# Patient Record
Sex: Female | Born: 1960 | Race: White | Hispanic: No
Health system: Southern US, Community
[De-identification: ages and names within clinical notes are randomized; demographics above are authoritative.]

## PROBLEM LIST (undated history)

## (undated) DIAGNOSIS — M1990 Unspecified osteoarthritis, unspecified site: Secondary | ICD-10-CM

## (undated) DIAGNOSIS — R1310 Dysphagia, unspecified: Secondary | ICD-10-CM

## (undated) DIAGNOSIS — E785 Hyperlipidemia, unspecified: Secondary | ICD-10-CM

## (undated) DIAGNOSIS — R609 Edema, unspecified: Secondary | ICD-10-CM

## (undated) DIAGNOSIS — F32A Depression, unspecified: Secondary | ICD-10-CM

## (undated) DIAGNOSIS — F329 Major depressive disorder, single episode, unspecified: Secondary | ICD-10-CM

## (undated) DIAGNOSIS — G4730 Sleep apnea, unspecified: Secondary | ICD-10-CM

## (undated) DIAGNOSIS — K219 Gastro-esophageal reflux disease without esophagitis: Secondary | ICD-10-CM

## (undated) DIAGNOSIS — K635 Polyp of colon: Secondary | ICD-10-CM

## (undated) DIAGNOSIS — E669 Obesity, unspecified: Secondary | ICD-10-CM

## (undated) DIAGNOSIS — I10 Essential (primary) hypertension: Secondary | ICD-10-CM

## (undated) HISTORY — PX: ELBOW SURGERY: SHX618

## (undated) HISTORY — PX: ABDOMINAL HYSTERECTOMY: SHX81

## (undated) HISTORY — PX: COLONOSCOPY: SHX174

## (undated) HISTORY — PX: ACHILLES TENDON REPAIR: SUR1153

## (undated) HISTORY — PX: REDUCTION MAMMAPLASTY: SUR839

## (undated) HISTORY — PX: KIDNEY STONE SURGERY: SHX686

## (undated) HISTORY — PX: JOINT REPLACEMENT: SHX530

## (undated) HISTORY — PX: ROTATOR CUFF REPAIR: SHX139

## (undated) HISTORY — PX: BACK SURGERY: SHX140

## (undated) HISTORY — PX: APPENDECTOMY: SHX54

## (undated) HISTORY — PX: UPPER GASTROINTESTINAL ENDOSCOPY: SHX188

---

## 2004-07-18 ENCOUNTER — Ambulatory Visit: Payer: Self-pay | Admitting: Pain Medicine

## 2004-08-20 ENCOUNTER — Ambulatory Visit: Payer: Self-pay | Admitting: Physician Assistant

## 2004-09-10 ENCOUNTER — Ambulatory Visit: Payer: Self-pay | Admitting: Pain Medicine

## 2004-09-24 ENCOUNTER — Ambulatory Visit: Payer: Self-pay | Admitting: Physician Assistant

## 2004-10-16 ENCOUNTER — Ambulatory Visit: Payer: Self-pay | Admitting: Physician Assistant

## 2004-11-12 ENCOUNTER — Ambulatory Visit: Payer: Self-pay | Admitting: Physician Assistant

## 2004-11-12 ENCOUNTER — Ambulatory Visit: Payer: Self-pay | Admitting: Urology

## 2004-11-19 ENCOUNTER — Emergency Department: Payer: Self-pay | Admitting: Emergency Medicine

## 2004-11-21 ENCOUNTER — Ambulatory Visit: Payer: Self-pay | Admitting: Urology

## 2004-11-23 ENCOUNTER — Emergency Department: Payer: Self-pay | Admitting: Emergency Medicine

## 2004-12-03 ENCOUNTER — Ambulatory Visit: Payer: Self-pay | Admitting: Urology

## 2004-12-11 ENCOUNTER — Ambulatory Visit: Payer: Self-pay | Admitting: Physician Assistant

## 2005-01-05 ENCOUNTER — Emergency Department: Payer: Self-pay | Admitting: Internal Medicine

## 2005-01-08 ENCOUNTER — Ambulatory Visit: Payer: Self-pay | Admitting: Physician Assistant

## 2005-02-13 ENCOUNTER — Ambulatory Visit: Payer: Self-pay | Admitting: Physician Assistant

## 2005-03-04 ENCOUNTER — Ambulatory Visit: Payer: Self-pay | Admitting: Internal Medicine

## 2005-03-14 ENCOUNTER — Ambulatory Visit: Payer: Self-pay | Admitting: Physician Assistant

## 2005-04-15 ENCOUNTER — Ambulatory Visit: Payer: Self-pay | Admitting: Physician Assistant

## 2005-05-15 ENCOUNTER — Ambulatory Visit: Payer: Self-pay | Admitting: Physician Assistant

## 2005-06-13 ENCOUNTER — Ambulatory Visit: Payer: Self-pay | Admitting: Physician Assistant

## 2005-07-10 ENCOUNTER — Ambulatory Visit: Payer: Self-pay | Admitting: Physician Assistant

## 2005-08-12 ENCOUNTER — Ambulatory Visit: Payer: Self-pay | Admitting: Physician Assistant

## 2005-09-16 ENCOUNTER — Ambulatory Visit: Payer: Self-pay | Admitting: Physician Assistant

## 2005-10-15 ENCOUNTER — Ambulatory Visit: Payer: Self-pay | Admitting: Physician Assistant

## 2005-11-18 ENCOUNTER — Ambulatory Visit: Payer: Self-pay | Admitting: Physician Assistant

## 2005-12-16 ENCOUNTER — Ambulatory Visit: Payer: Self-pay | Admitting: Physician Assistant

## 2006-01-13 ENCOUNTER — Ambulatory Visit: Payer: Self-pay | Admitting: Pain Medicine

## 2006-01-28 ENCOUNTER — Ambulatory Visit: Payer: Self-pay | Admitting: Pain Medicine

## 2006-02-04 ENCOUNTER — Ambulatory Visit: Payer: Self-pay | Admitting: Pain Medicine

## 2006-03-10 ENCOUNTER — Ambulatory Visit: Payer: Self-pay | Admitting: Physician Assistant

## 2006-04-01 ENCOUNTER — Ambulatory Visit: Payer: Self-pay | Admitting: Pain Medicine

## 2006-04-09 ENCOUNTER — Ambulatory Visit: Payer: Self-pay | Admitting: Pain Medicine

## 2006-04-29 ENCOUNTER — Ambulatory Visit: Payer: Self-pay | Admitting: Pain Medicine

## 2006-06-03 ENCOUNTER — Ambulatory Visit: Payer: Self-pay | Admitting: Pain Medicine

## 2006-06-30 ENCOUNTER — Ambulatory Visit: Payer: Self-pay | Admitting: Physician Assistant

## 2006-08-06 ENCOUNTER — Ambulatory Visit: Payer: Self-pay | Admitting: Physician Assistant

## 2006-09-02 ENCOUNTER — Ambulatory Visit: Payer: Self-pay | Admitting: Physician Assistant

## 2006-09-28 ENCOUNTER — Emergency Department: Payer: Self-pay | Admitting: Emergency Medicine

## 2006-10-14 ENCOUNTER — Ambulatory Visit: Payer: Self-pay | Admitting: Pain Medicine

## 2006-11-11 ENCOUNTER — Ambulatory Visit: Payer: Self-pay | Admitting: Pain Medicine

## 2006-12-10 ENCOUNTER — Ambulatory Visit: Payer: Self-pay | Admitting: Physician Assistant

## 2006-12-24 ENCOUNTER — Ambulatory Visit: Payer: Self-pay | Admitting: Physician Assistant

## 2007-01-06 ENCOUNTER — Ambulatory Visit: Payer: Self-pay | Admitting: Physician Assistant

## 2007-01-20 ENCOUNTER — Ambulatory Visit: Payer: Self-pay | Admitting: Physician Assistant

## 2007-02-03 ENCOUNTER — Ambulatory Visit: Payer: Self-pay | Admitting: Physician Assistant

## 2007-03-05 ENCOUNTER — Ambulatory Visit: Payer: Self-pay | Admitting: Physician Assistant

## 2007-03-05 ENCOUNTER — Ambulatory Visit: Payer: Self-pay | Admitting: Pain Medicine

## 2007-03-25 ENCOUNTER — Ambulatory Visit: Payer: Self-pay | Admitting: Physician Assistant

## 2007-04-08 ENCOUNTER — Encounter: Payer: Self-pay | Admitting: Physician Assistant

## 2007-04-29 ENCOUNTER — Ambulatory Visit: Payer: Self-pay | Admitting: Physician Assistant

## 2007-06-02 ENCOUNTER — Ambulatory Visit: Payer: Self-pay | Admitting: Physician Assistant

## 2007-07-01 ENCOUNTER — Ambulatory Visit: Payer: Self-pay | Admitting: Physician Assistant

## 2007-07-29 ENCOUNTER — Ambulatory Visit: Payer: Self-pay | Admitting: Physician Assistant

## 2007-08-19 ENCOUNTER — Ambulatory Visit: Payer: Self-pay

## 2007-08-31 ENCOUNTER — Other Ambulatory Visit: Payer: Self-pay

## 2007-08-31 ENCOUNTER — Ambulatory Visit: Payer: Self-pay | Admitting: Physician Assistant

## 2007-08-31 ENCOUNTER — Ambulatory Visit: Payer: Self-pay | Admitting: Orthopaedic Surgery

## 2007-09-03 ENCOUNTER — Ambulatory Visit: Payer: Self-pay | Admitting: Orthopaedic Surgery

## 2007-09-27 ENCOUNTER — Ambulatory Visit: Payer: Self-pay | Admitting: Physician Assistant

## 2007-10-27 ENCOUNTER — Ambulatory Visit: Payer: Self-pay | Admitting: Physician Assistant

## 2007-11-29 ENCOUNTER — Ambulatory Visit: Payer: Self-pay | Admitting: Physician Assistant

## 2007-12-28 ENCOUNTER — Ambulatory Visit: Payer: Self-pay | Admitting: Pain Medicine

## 2008-01-01 ENCOUNTER — Emergency Department: Payer: Self-pay | Admitting: Emergency Medicine

## 2008-01-31 ENCOUNTER — Ambulatory Visit: Payer: Self-pay

## 2008-02-24 ENCOUNTER — Ambulatory Visit: Payer: Self-pay | Admitting: Physician Assistant

## 2008-03-20 ENCOUNTER — Encounter: Payer: Self-pay | Admitting: Orthopaedic Surgery

## 2008-03-21 ENCOUNTER — Ambulatory Visit: Payer: Self-pay | Admitting: Physician Assistant

## 2008-04-05 ENCOUNTER — Encounter: Payer: Self-pay | Admitting: Orthopaedic Surgery

## 2008-04-25 ENCOUNTER — Ambulatory Visit: Payer: Self-pay | Admitting: Physician Assistant

## 2008-05-01 ENCOUNTER — Ambulatory Visit: Payer: Self-pay | Admitting: Pain Medicine

## 2008-05-29 ENCOUNTER — Ambulatory Visit: Payer: Self-pay | Admitting: Physician Assistant

## 2008-06-13 ENCOUNTER — Ambulatory Visit (HOSPITAL_BASED_OUTPATIENT_CLINIC_OR_DEPARTMENT_OTHER): Admission: RE | Admit: 2008-06-13 | Discharge: 2008-06-13 | Payer: Self-pay | Admitting: Orthopedic Surgery

## 2008-06-19 ENCOUNTER — Ambulatory Visit: Payer: Self-pay | Admitting: Pain Medicine

## 2008-06-28 ENCOUNTER — Ambulatory Visit: Payer: Self-pay | Admitting: Physician Assistant

## 2008-07-27 ENCOUNTER — Ambulatory Visit: Payer: Self-pay | Admitting: Physician Assistant

## 2008-09-25 ENCOUNTER — Ambulatory Visit: Payer: Self-pay | Admitting: Physician Assistant

## 2008-10-31 ENCOUNTER — Ambulatory Visit: Payer: Self-pay | Admitting: Physician Assistant

## 2009-05-01 ENCOUNTER — Encounter: Payer: Self-pay | Admitting: Pain Medicine

## 2009-10-06 ENCOUNTER — Ambulatory Visit: Payer: Self-pay | Admitting: Pain Medicine

## 2010-02-05 ENCOUNTER — Ambulatory Visit: Payer: Self-pay | Admitting: General Practice

## 2010-07-09 ENCOUNTER — Emergency Department: Payer: Self-pay | Admitting: Emergency Medicine

## 2011-02-18 NOTE — Op Note (Signed)
Emily Velasquez, Emily Velasquez             ACCOUNT NO.:  [id number]   MEDICAL RECORD NO.:  [id number]          PATIENT TYPE:  AMB   LOCATION:  DSC                          FACILITY:  [facility]   PHYSICIAN:  Robert A. Thurston Hole, M.D. DATE OF BIRTH:  [birth year]   DATE OF PROCEDURE:  06/13/2008  DATE OF DISCHARGE:  06/13/2008                               OPERATIVE REPORT   PREOPERATIVE DIAGNOSES:  1. Right knee medial and lateral meniscal tears with chondromalacia      and synovitis.  2. Left knee degenerative joint disease.   POSTOPERATIVE DIAGNOSES:  1. Right knee medial and lateral meniscal tears with chondromalacia      and synovitis.  2. Left knee degenerative joint disease.   PROCEDURE:  1. Right knee examination under anesthesia followed by arthroscopic      partial medial and lateral meniscectomies.  2. Right knee chondroplasty with partial synovectomy.  3. Left knee Supartz injection.   SURGEON:  Elana Alm. Thurston Hole, MD   ASSISTANT:  Julien Girt, PA   ANESTHESIA:  Local and MAC.   OPERATIVE TIME:  45 minutes.   COMPLICATIONS:  None.   INDICATIONS FOR PROCEDURE:  [name] is a woman who has had  significant bilateral knee pain, right worse than left over the past  year, increasing in nature.  Exam and MRI has revealed meniscal tearing  with chondromalacia and synovitis.  She has failed conservative care and  is now to undergo right knee arthroscopy.  Her left knee had significant  DJD and she is in the midst of Supartz series of injections and will  have another of these injections today at the same time.   DESCRIPTION:  [name] was brought to the operating room on June 13, 2008, after a knee block was placed in right knee by Anesthesia in  the Holding Room.  She was placed on the operative table in the supine  position.  Her right knee was examined.  Range of motion 0-125 degrees,  1 to 2+ crepitation of knee, stable ligamentous exam with normal  patellar  tracking.  Left knee revealed full range of motion with 1 to 2+  crepitation of knee, stable ligamentous exam with normal patellar  tracking.  The left knee was injected under sterile conditions with  Supartz injection.  She tolerated this well.  The right leg was then  prepped using sterile DuraPrep and draped using sterile technique.  Originally, through an anterolateral portal, the arthroscope with a pump  attached was placed into an anteromedial portal and an arthroscopic  probe was placed.  On initial inspection, medial compartment and  articular cartilage showed 75% grade 3 chondromalacia which was  debrided.  Medial meniscus showed tearing of the posterior and medial  horn of which 50% was resected back to a stable rim.  Intercondylar  notch was inspected.  Anterior and posterior cruciate ligaments were  normal.  Lateral compartment showed 30% grade 3 chondromalacia of  lateral tibial plateau which was debrided.  Lateral femoral condyle  showed grade 1 and 2 changes.  Lateral meniscus showed tearing of the  posterior and lateral horn of which 30% was resected back to a stable  rim.  Patellofemoral joint showed 30-40% grade 3 chondromalacia on the  patellofemoral groove and this was debrided.  The patella tracked  normally.  Moderate synovitis and medial lateral gutters were debrided,  otherwise this was free of pathology.  After this was done, it felt that  all pathology had been satisfactorily addressed.  The instruments were  removed.  Portals were closed with 3-0 nylon suture.  Sterile dressings  were applied, and the patient was awakened and taken to the recovery  room in stable condition.   FOLLOWUP CARE:  She will be treated as an outpatient on Percocet and  Robaxin.  She will be seen back in the office in a week for sutures out  and followup.      Robert A. Thurston Hole, M.D.  Electronically Signed     RAW/MEDQ  D:  11/21/2008  T:  11/21/2008  Job:  [id number]

## 2011-02-18 NOTE — H&P (Signed)
NAMELASHAWNNA, LAMBRECHT             ACCOUNT NO.:  [id number]   MEDICAL RECORD NO.:  [id number]          PATIENT TYPE:  AMB   LOCATION:  DSC                          FACILITY:  [facility]   PHYSICIAN:  Eduard Clos, MDDATE OF BIRTH:  [birth year]   DATE OF ADMISSION:  06/10/2008  DATE OF DISCHARGE:                              HISTORY & PHYSICAL   CHIEF COMPLAINT:  The patient transferred from [facility] for further  management of acute pancreatitis with possible necrosis.   HISTORY OF PRESENT ILLNESS:  A 50 year old female with a history of  recurrent pancreatitis with pseudocyst, history of diabetes mellitus  type 2, hyperlipidemia.  She presented to the ER at [facility] with  abdominal pain.  The patient stated the abdominal pain started from  yesterday morning, associated with nausea and vomiting and 2 episodes of  diarrhea.  Denies any blood in the vomitus or diarrhea.  The patient's  pain is more in the left upper quadrant, nonradiating, dull and aching,  constant.  Not related to any food or diarrhea or vomiting.  The patient  has had similar symptoms before when she had exacerbation of her  pancreatitis.  She desired to go to the ER.   In the ER at [facility] the patient had a CAT scan of the abdomen and  pelvis done, which showed possibility of  pancreatitis with necrosis and  splenic vein thrombosis.  The patient was transferred to [facility] for  further management.   Presently the patient states that the pain is around 4-5.  The patient  is not in any acute stress.  She denies any chest pain, shortness of  breath.  She denies any weakness of limbs.  Denies any loss of  consciousness.  Denies any fever or chills, dysuria.   PAST MEDICAL HISTORY:  1. History of recurrent pancreatitis, with pseudocyst.  2. Diabetes mellitus type 2.  3. Hyperlipidemia.   PAST SURGICAL HISTORY:  1. Tubal ligation.  2. Cholecystectomy.  3. History of tracheostomy for respiratory failure,  secondary to acute      pancreatitis.   MEDICATIONS PRIOR TO ADMISSION:  1. Actos 45 mg p.o. daily.  2. Vytorin 10/20 mg p.o. daily.  3. Prilosec 20 mg twice a day.  4. Phenergan 25 mg p.o. as needed.  5. Loped.  6. Vicodin.  7. Advil.  8. Excedrin.  9. Tramadol.   ALLERGIES:  CODEINE, PENICILLIN.   SOCIAL HISTORY:  The patient smokes cigarettes; has been advised to quit  smoking.  Denies any alcohol or drug abuse.  Lives with her boyfriend  and her 55 year old son.   REVIEW OF SYSTEMS:  As per history of present illness, nothing else  significant.   PHYSICAL EXAMINATION:  The patient examined at bedside, not in acute  distress.  VITAL SIGNS:  Blood pressure 109/70, pulse 110 per minute, temperature  99, respirations 18 per minute, O2 saturations 90% on room air.  HEENT:  Anicteric.  No pallor.  CHEST:  Bilateral air and depressed.  No rhonchi.  No crepitation.  HEART:  S1 and S2 heard.  ABDOMEN:  Soft, nontender.  Bowel sounds heard.  No guarding, no  rigidity, no discoloration seen.  NEUROLOGIC:  This patient is alert, awake; oriented to time, place and  person.  Moves upper and lower extremities 5/5.  EXTREMITIES:  Peripheral pulses felt.  No edema.   LABS:  CAT scan of the abdomen and pelvis with contrast shows findings  suspicious for pancreatitis with necrosis, involving the upstream body  and pancreatic tail.  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  No acute pelvic process.   CBC:  WBC 13.9, hemoglobin 14.8, hematocrit 42.3, platelets 170,  neutrophils 89%.  PT 11.9, INR  0.9.  Complete Metabolic Panel:  Sodium  132, potassium 5, chloride 96, carbon dioxide 21, glucose 341, BUN 11,  creatinine 0.28.  Total bilirubin 1.9, direct bilirubin  0.5, indirect  bilirubin 1.4.  alkaline phosphatase 99.  AST 43, ALT 24, total protein  8.1, albumin 4.2, calcium 9.8.  Lipase 109.  Pregnancy screen negative.  Urine shows blood moderate, protein more than 300, ketones trace,  glucose more than 1000, wbc 0-2, bacteria rare.   ASSESSMENT:  1. Acute on chronic pancreatitis:  With possibility of necrosis and      splenic vein thrombosis.  2. Persistent pancreatic pseudocyst.  3. Diabetes mellitus type 2, uncontrolled.  4. Possible urinary tract infection.  5. History of hyperlipidemia.   PLAN:  Admit the patient to telemetry.  Will keep the patient n.p.o.  Aggressively IV hydrate the patient.  Obtain blood cultures and urine  cultures.  Start the patient on empiric antibiotics.  The patient's  blood sugar uncontrolled, but as patient is n.p.o. will place the  patient on a sliding scale.  We will get a surgical consult.  Place the  patient on SCDs and gastrointestinal prophylaxis.  Further  recommendations as the patient's condition evolves.      Eduard Clos, MD  Electronically Signed     ANK/MEDQ  D:  06/10/2008  T:  06/10/2008  Job:  [id number]

## 2011-04-21 ENCOUNTER — Ambulatory Visit: Payer: Self-pay | Admitting: Internal Medicine

## 2011-04-28 ENCOUNTER — Ambulatory Visit: Payer: Self-pay | Admitting: Neurosurgery

## 2011-05-08 ENCOUNTER — Ambulatory Visit: Payer: Self-pay | Admitting: Urology

## 2011-05-15 ENCOUNTER — Ambulatory Visit: Payer: Self-pay | Admitting: Urology

## 2011-05-21 ENCOUNTER — Ambulatory Visit: Payer: Self-pay | Admitting: Urology

## 2011-07-01 ENCOUNTER — Encounter (HOSPITAL_COMMUNITY)
Admission: RE | Admit: 2011-07-01 | Discharge: 2011-07-01 | Disposition: A | Discharge status: Home / Self Care | Payer: Medicare Other | Source: Ambulatory Visit | Attending: Orthopedic Surgery | Admitting: Orthopedic Surgery

## 2011-07-01 ENCOUNTER — Ambulatory Visit (HOSPITAL_COMMUNITY)
Admission: RE | Admit: 2011-07-01 | Discharge: 2011-07-01 | Disposition: A | Discharge status: Home / Self Care | Payer: Medicare Other | Source: Ambulatory Visit | Attending: Orthopedic Surgery | Admitting: Orthopedic Surgery

## 2011-07-01 ENCOUNTER — Other Ambulatory Visit (HOSPITAL_COMMUNITY): Payer: Self-pay | Admitting: Orthopedic Surgery

## 2011-07-01 DIAGNOSIS — Z01812 Encounter for preprocedural laboratory examination: Secondary | ICD-10-CM | POA: Insufficient documentation

## 2011-07-01 DIAGNOSIS — I517 Cardiomegaly: Secondary | ICD-10-CM | POA: Insufficient documentation

## 2011-07-01 DIAGNOSIS — Z01811 Encounter for preprocedural respiratory examination: Secondary | ICD-10-CM

## 2011-07-01 DIAGNOSIS — Z01818 Encounter for other preprocedural examination: Secondary | ICD-10-CM | POA: Insufficient documentation

## 2011-07-01 LAB — COMPREHENSIVE METABOLIC PANEL
Albumin: 3.8 g/dL (ref 3.5–5.2)
Alkaline Phosphatase: 88 U/L (ref 39–117)
BUN: 12 mg/dL (ref 6–23)
Calcium: 10 mg/dL (ref 8.4–10.5)
Creatinine, Ser: 0.77 mg/dL (ref 0.50–1.10)
GFR calc Af Amer: >60 mL/min (ref >60)
Glucose, Bld: 85 mg/dL (ref 70–99)
Potassium: 4.2 mEq/L (ref 3.5–5.1)
Total Protein: 6.8 g/dL (ref 6.0–8.3)

## 2011-07-01 LAB — APTT: aPTT: 30 seconds (ref 24–37)

## 2011-07-01 LAB — DIFFERENTIAL
Eosinophils Relative: 3 % (ref 0–5)
Lymphocytes Relative: 42 % (ref 12–46)
Lymphs Abs: 2.8 10*3/uL (ref 0.7–4.0)
Monocytes Absolute: 0.5 10*3/uL (ref 0.1–1.0)

## 2011-07-01 LAB — URINALYSIS, ROUTINE W REFLEX MICROSCOPIC
Glucose, UA: NEGATIVE mg/dL
Ketones, ur: NEGATIVE mg/dL
Nitrite: NEGATIVE
pH: 6.5 (ref 5.0–8.0)

## 2011-07-01 LAB — CBC
HCT: 39.8 % (ref 36.0–46.0)
MCHC: 34.7 g/dL (ref 30.0–36.0)
MCV: 95 fL (ref 78.0–100.0)
RDW: 12.8 % (ref 11.5–15.5)
WBC: 6.7 10*3/uL (ref 4.0–10.5)

## 2011-07-01 LAB — SURGICAL PCR SCREEN: MRSA, PCR: NEGATIVE

## 2011-07-01 LAB — URINE MICROSCOPIC-ADD ON

## 2011-07-07 ENCOUNTER — Inpatient Hospital Stay (HOSPITAL_COMMUNITY)
Admission: RE | Admit: 2011-07-07 | Discharge: 2011-07-10 | DRG: 462 | Disposition: A | Discharge status: Home Health | Payer: Medicare Other | Source: Ambulatory Visit | Attending: Orthopedic Surgery | Admitting: Orthopedic Surgery

## 2011-07-07 DIAGNOSIS — N390 Urinary tract infection, site not specified: Secondary | ICD-10-CM | POA: Diagnosis not present

## 2011-07-07 DIAGNOSIS — I10 Essential (primary) hypertension: Secondary | ICD-10-CM | POA: Diagnosis present

## 2011-07-07 DIAGNOSIS — F17200 Nicotine dependence, unspecified, uncomplicated: Secondary | ICD-10-CM | POA: Diagnosis present

## 2011-07-07 DIAGNOSIS — Z79899 Other long term (current) drug therapy: Secondary | ICD-10-CM

## 2011-07-07 DIAGNOSIS — J449 Chronic obstructive pulmonary disease, unspecified: Secondary | ICD-10-CM | POA: Diagnosis present

## 2011-07-07 DIAGNOSIS — J4489 Other specified chronic obstructive pulmonary disease: Secondary | ICD-10-CM | POA: Diagnosis present

## 2011-07-07 DIAGNOSIS — K5900 Constipation, unspecified: Secondary | ICD-10-CM | POA: Diagnosis present

## 2011-07-07 DIAGNOSIS — G8929 Other chronic pain: Secondary | ICD-10-CM | POA: Diagnosis present

## 2011-07-07 DIAGNOSIS — E669 Obesity, unspecified: Secondary | ICD-10-CM | POA: Diagnosis present

## 2011-07-07 DIAGNOSIS — D62 Acute posthemorrhagic anemia: Secondary | ICD-10-CM | POA: Diagnosis not present

## 2011-07-07 DIAGNOSIS — E871 Hypo-osmolality and hyponatremia: Secondary | ICD-10-CM | POA: Diagnosis present

## 2011-07-07 DIAGNOSIS — M1710 Unilateral primary osteoarthritis, unspecified knee: Principal | ICD-10-CM | POA: Diagnosis present

## 2011-07-07 DIAGNOSIS — M549 Dorsalgia, unspecified: Secondary | ICD-10-CM | POA: Diagnosis present

## 2011-07-07 LAB — TYPE AND SCREEN
ABO/RH(D): A NEG
Antibody Screen: NEGATIVE

## 2011-07-08 LAB — BASIC METABOLIC PANEL
Calcium: 8.8 mg/dL (ref 8.4–10.5)
Creatinine, Ser: 0.66 mg/dL (ref 0.50–1.10)
GFR calc Af Amer: >90 mL/min (ref >90)
GFR calc non Af Amer: >90 mL/min (ref >90)
Sodium: 140 mEq/L (ref 135–145)

## 2011-07-08 LAB — CBC
MCH: 32.5 pg (ref 26.0–34.0)
MCV: 95.2 fL (ref 78.0–100.0)
Platelets: 204 10*3/uL (ref 150–400)
RDW: 12.6 % (ref 11.5–15.5)

## 2011-07-08 LAB — URINE CULTURE

## 2011-07-09 LAB — CBC
HCT: 27.7 % — ABNORMAL LOW (ref 36.0–46.0)
Hemoglobin: 9.6 g/dL — ABNORMAL LOW (ref 12.0–15.0)
MCH: 33.1 pg (ref 26.0–34.0)
MCV: 95.5 fL (ref 78.0–100.0)
RBC: 2.9 MIL/uL — ABNORMAL LOW (ref 3.87–5.11)

## 2011-07-09 LAB — BASIC METABOLIC PANEL
BUN: 9 mg/dL (ref 6–23)
CO2: 29 mEq/L (ref 19–32)
Calcium: 8.8 mg/dL (ref 8.4–10.5)
Creatinine, Ser: 0.79 mg/dL (ref 0.50–1.10)
Glucose, Bld: 109 mg/dL — ABNORMAL HIGH (ref 70–99)
Sodium: 134 mEq/L — ABNORMAL LOW (ref 135–145)

## 2011-07-09 LAB — POCT I-STAT, CHEM 8
Creatinine, Ser: 0.9
Glucose, Bld: 89
Hemoglobin: 11.9 — ABNORMAL LOW
Potassium: 3.6

## 2011-07-10 LAB — BASIC METABOLIC PANEL
BUN: 11 mg/dL (ref 6–23)
Calcium: 9.3 mg/dL (ref 8.4–10.5)
GFR calc non Af Amer: >90 mL/min (ref >90)
Glucose, Bld: 115 mg/dL — ABNORMAL HIGH (ref 70–99)
Sodium: 139 mEq/L (ref 135–145)

## 2011-07-10 LAB — CBC
Hemoglobin: 9.5 g/dL — ABNORMAL LOW (ref 12.0–15.0)
MCH: 32.1 pg (ref 26.0–34.0)
MCHC: 33.7 g/dL (ref 30.0–36.0)

## 2011-07-15 NOTE — Op Note (Signed)
Emily Velasquez, Emily Velasquez             ACCOUNT NO.:  [id number]  MEDICAL RECORD NO.:  [id number]  LOCATION:  5024                         FACILITY:  [facility]  PHYSICIAN:  Molly Maduro A. Thurston Hole, M.D. DATE OF BIRTH:  1961/08/09  DATE OF PROCEDURE:  07/07/2011 DATE OF DISCHARGE:                              OPERATIVE REPORT   PREOPERATIVE DIAGNOSES: 1. Right knee degenerative joint disease. 2. Left knee degenerative joint disease.  POSTOPERATIVE DIAGNOSES: 1. Right knee degenerative joint disease. 2. Left knee degenerative joint disease.  PROCEDURES: 1. Right total knee replacement using DePuy cemented total knee system     with #2.5 cemented femur, #3 cemented tibia with 12.5-mm     polyethylene RP tibial spacer and 32-mm polyethylene cemented     patella. 2. Left total knee replacement using DePuy cemented total knee system     with #3 cemented femur, #3 cemented tibia with 15-mm polyethylene     RP tibial spacer and 32-mm polyethylene cemented patella. 3. Zinacef impregnated cement.  SURGEON:  Elana Alm. Thurston Hole, MD  ASSISTANT:  Julien Girt, PA-C  ANESTHESIA:  General.  OPERATIVE TIME:  Right knee 1 hour and 15 minutes, left knee 1 hour and 25 minutes.  COMPLICATIONS:  None.  DESCRIPTION OF PROCEDURE:  Emily Velasquez was brought to the operating room on July 07, 2011, after bilateral femoral nerve blocks had been placed by Anesthesia in the holding room.  She was placed on the operative table in supine position.  She received Ancef 2 g IV preoperatively for prophylaxis.  After being placed under general anesthesia, she had a Foley catheter placed under sterile conditions.  Each knee was examined under anesthesia.  The right knee, she had range of motion from -5 to 125 degrees, mild varus deformity, knee stable ligamentous exam with normal patellar tracking.  Left knee, she had range of motion -8 to 125 degrees, moderate varus deformity, knee stable ligamentous exam  with normal patellar tracking.  Both legs were then prepped using sterile DuraPrep and draped using sterile technique.  Time-out procedure was called and both knees were identified as the correct knees.  Initially, the right knee was approached surgically.  The right leg was exsanguinated and a thigh tourniquet elevated at 365 mm.  Initially, through a 15-cm longitudinal incision based over the patella, initial exposure was made.  The underlying subcutaneous tissues were incised along with skin incision.  A median arthrotomy was performed revealing an excessive amount of normal-appearing joint fluid.  The articular surfaces were inspected.  She had grade 4 changes medially, grade 3 and 4 changes laterally and in the patellofemoral joint.  Osteophytes were removed from the femoral condyles and tibial plateau.  The medial and lateral meniscal remnants were removed as well as the anterior cruciate ligament.  Intramedullary drill was then drilled up the femoral canal for placement of distal femoral cutting jig which was placed in the appropriate manner rotation and distal 12-mm cut was made.  The distal femur was incised.  #2.5 was found to be the appropriate size.  A #2.5 cutting jig was placed in the appropriate manner of external rotation and then these cuts were made.  The proximal tibia was then  exposed. The tibial spines were removed with an oscillating saw.  Intramedullary drill was drilled down the tibial canal for placement of proximal tibial cutting jig which was placed in appropriate manner rotation and a proximal 4-mm cut was made based off the medial or lower side.  Spacer blocks were then placed in flexion and extension.  12.5-mm blocks gave excellent balancing, excellent stability, and excellent correction of her flexion and varus deformities.  At this point, #3 tibial baseplate trial was placed on the cut tibial surface with an excellent fit and the keel cut was made.  The PCL  box cutter was then placed on the distal femur and these cuts were made.  At this point, a #2.5 femoral trial was placed with a #3 tibial baseplate trial and a 12.5-mm polyethylene RP tibial spacer.  Knee was reduced, taken through range of motion from 0- 125 degrees with excellent stability and excellent correction of her flexion and varus deformities and normal patellar tracking.  A resurfacing 9-mm cut was then made on the patella and 3 locking holes placed for a 32-mm polyethylene patellar trial and again, patellofemoral tracking was evaluated and found to be normal.  At this point, it was felt that all trial components were of excellent size, fit, and stability.  They were then removed.  The knee was then gently lavaged, irrigated with 3 L of saline.  The proximal tibia was then exposed and a #3 tibial baseplate with Zinacef-impregnated cement backing was hammered into position with an excellent fit with excess cement being removed around the edges.  #2.5 femoral component with cement backing was hammered into position also with an excellent fit with excess cement being removed from around the edges.  The 12.5-mm polyethylene RP tibial spacer was then placed on the tibial baseplate.  The knee reduced, taken through range of motion from 0-125 degrees with excellent stability and excellent correction of her flexion and varus deformities.  The 32-mm polyethylene cement backed patella was then placed in its position and held there with a clamp.  After the cement hardened, again patellofemoral tracking was evaluated and found to be normal.  At this point, it was felt that all components were of excellent size, fit, and stability.  The wound was further irrigated with saline.  Tourniquet was released.  Hemostasis was obtained with cautery.  The arthrotomy was then closed with #1 Ethibond suture.  Subcutaneous tissues closed with 0 and 2-0 Vicryl.  Subcuticular layer closed with 4-0  Monocryl.  At this point, the patient was stable hemodynamically and thus, we proceeded with the second knee with the left total knee replacement. Left leg was exsanguinated and a thigh tourniquet elevated at 365 mm. Initially, through a 15-cm longitudinal incision based over the patella, initial exposure was made.  The underlying subcutaneous tissues were incised along with skin incision.  A median arthrotomy was performed and an excessive amount of normal-appearing joint fluid was found.  The medial and lateral meniscal remnants were removed as well as the anterior cruciate ligament.  She was found to have grade 4 changes medially, grade 3 and 4 changes laterally, and the patellofemoral joint. After the osteophytes and meniscal remnants were removed, an intramedullary drill was drilled up the femoral canal for placement of distal femoral cutting jig which was placed in the appropriate manner rotation and a distal 12-mm cut was made.  The distal femur was incised. #3 was found to be the appropriate size.  #3 cutting jig was placed  in the appropriate manner of external rotation and then these cuts were made.  The proximal tibia was then exposed.  The tibial spines were removed with an oscillating saw.  Intramedullary drill was then drilled down the tibial canal for placement of proximal tibial cutting jig which was placed in the appropriate manner of rotation and a proximal 4-mm cut was made based off the medial or lower side.  Spacer blocks were then placed in flexion and extension.  15-mm blocks gave excellent balancing, excellent stability, and excellent correction of her flexion and varus deformities.  At this point, the #3 tibial baseplate trial was placed on the cut tibial surface with an excellent fit and the keel cut was made. The PCL box cutter was then placed on the distal femur and these cuts were made.  At this point with the #3 femoral trial in place, a #3 tibial baseplate  trial, and a 15-mm polyethylene RP tibial spacer, knee was reduced, taken through range of motion from 0-125 degrees with excellent stability and excellent correction of her flexion and varus deformities and normal patellar tracking.  A resurfacing 9-mm cut was then made on the patella and 3 locking holes placed for a 32-mm polyethylene patellar trial.  Again, patellofemoral tracking was evaluated and found to be normal.  At this point, it was felt that all the trial components were of excellent size, fit, and stability.  They were then removed.  The knee was then gently lavaged, irrigated 3 L of saline.  The proximal tibia was then exposed.  A #3 tibial baseplate with Zinacef-impregnated cement backing was hammered into position with an excellent fit with excess cement being removed from around the edges. #3 femoral component with cement backing was hammered into position also with an excellent fit with excess cement being removed from around the edges.  The 50-mm polyethylene RP tibial spacer was placed on tibial baseplate.  The knee reduced, taken through range of motion from 0-125 degrees with excellent stability and excellent correction of her flexion and varus deformities.  The 32-mm polyethylene cement backed patella was then placed in its position and held there with a clamp.  After the cement hardened, again patellofemoral tracking was evaluated and found to be normal.  At this point, it was felt that all the components were of excellent size, fit, and stability.  The wound was further irrigated with saline.  Tourniquet was released.  Hemostasis obtained with cautery.  The arthrotomy was then closed with #1 Ethibond suture over 2 medium Hemovac drains.  Subcutaneous tissues were closed with 0 and 2-0 Vicryl, subcuticular layer closed with 4-0 Monocryl.  Sterile dressings were applied and a long-leg splint bilaterally.  The patient then awakened, extubated, and taken to recovery  room in stable condition. Needle and sponge counts correct x2 at the end of the case. Neurovascular status normal.  Pulses 2+ and symmetric.     Yordin Rhoda A. Thurston Hole, M.D.     RAW/MEDQ  D:  07/07/2011  T:  07/08/2011  Job:  [id number]  Electronically Signed by Salvatore Marvel M.D. on 07/15/2011 07:50:22 AM

## 2011-07-31 NOTE — Discharge Summary (Signed)
Emily Velasquez, Emily Velasquez             ACCOUNT NO.:  [id number]  MEDICAL RECORD NO.:  [id number]  LOCATION:  5016                         FACILITY:  [facility]  PHYSICIAN:  Elana Alm. Thurston Hole, M.D. DATE OF BIRTH:  November 29, 1960  DATE OF ADMISSION:  07/07/2011 DATE OF DISCHARGE:  07/10/2011                              DISCHARGE SUMMARY   ADMITTING DIAGNOSES: 1. End-stage degenerative joint disease, both knees. 2. Hypertension. 3. Tobacco abuse. 4. Preoperative urinary tract infection. 5. Chronic back pain.  DISCHARGE DIAGNOSES: 1. End-stage degenerative joint disease, bilateral knees status post     total knee replacement. 2. Acute blood loss anemia. 3. Tobacco abuse. 4. Preoperative urinary tract infection. 5. Chronic back pain.  HISTORY OF PRESENT ILLNESS:  The patient is a 50 year old female with a history of bilateral total knee replacements.  She has failed interarticular cortisone injections, Supartz injections, and narcotic pain medicine.  Risks, benefits, and possible complications of total knee replacement have been discussed in detail with her and she is without question.  PROCEDURES IN-HOUSE:  On July 07, 2011, the patient underwent bilateral total knee replacement by Dr. Thurston Hole, bilateral femoral nerve block by Anesthesia and bilateral Autovac transfusions.  She tolerated all the procedures well and was admitted postoperatively for pain control, DVT prophylaxis, and physical therapy.  HOSPITAL COURSE:  In the recovery room, she is placed in bilateral CPM 0- 40 degrees.  She tolerated this well.  She received Autovac transfusions on postop day zero and tolerated both Autovac transfusions well.  Postop day #1, the patient was up with physical therapy, ambulating with bilateral total knee replacement.  She was 2+ assist.  Postop day #2, the patient's ambulation improved.  She was slightly hyponatremic with a sodium of 134.  Surgical wounds were well-approximated.  She had  a half a bottle of mag citrate.  Her constipation resolved.  She ambulated 180 feet with physical therapy using knee immobilizer on the left.  Postop day #3, the patient is alert and oriented running a low-grade fever of 100.  She did have a history of her preop UTI.  She will continue her Cipro postoperatively, so that she has a full 10-day course of Cipro for her UTI.  She will have home health physical therapy and home health occupational therapy.  She is being discharged to her parents' house. Weightbearing as tolerated.  DISCHARGE MEDICATIONS: 1. Diazepam 5 mg 1-2 p.o. at bedtime p.r.n. sleep. 2. Colace 100 mg 1 tablet twice a day while on narcotics. 3. Lovenox 30 mg subcu twice a day for 12 days. 4. OxyContin SR 40 mg 1 tablet every 12 hours. 5. Percocet 10/325 one to two q.4-6 h. p.r.n. pain. 6. Cipro 500 mg 1 tablet twice a day. 7. Crestor 1 tablet daily. 8. Losartan/hydrochlorothiazide 50/12.5 one tablet daily.  Do not take     if your blood pressure is less than 120/80. 9. Metoprolol XL 50 mg 1 tablet daily.  DISPOSITION:  She is weightbearing as tolerated.  She is on a regular diet.  She will get home health physical therapy and home health occupational therapy.  She will follow up with Dr. Thurston Hole in 2 weeks for wound check and x-rays  at that time.  She has been instructed to call our office with increased pain, increased swelling, increased redness, or a temperature greater than 101.  At this point in time, her surgical wounds were well approximated.  She has no drainage, no redness, and a moderate amount of swelling.     Kirstin Shepperson, PA-C   ______________________________ Elana Alm Thurston Hole, M.D.    KS/MEDQ  D:  07/10/2011  T:  07/10/2011  Job:  [id number]  Electronically Signed by Julien Girt P.A. on 07/15/2011 11:34:26 AM Electronically Signed by Salvatore Marvel M.D. on 07/31/2011 11:46:16 AM

## 2011-11-21 ENCOUNTER — Ambulatory Visit: Payer: Self-pay | Admitting: Urology

## 2012-03-29 ENCOUNTER — Ambulatory Visit: Payer: Self-pay | Admitting: Neurosurgery

## 2012-03-29 LAB — CREATININE, SERUM
Creatinine: 0.72 mg/dL (ref 0.60–1.30)
EGFR (Non-African Amer.): >60

## 2012-07-14 ENCOUNTER — Ambulatory Visit: Payer: Self-pay | Admitting: Internal Medicine

## 2012-10-01 IMAGING — CR DG CHEST 2V
2 series · 2 of 2 positions shown · non-contrast
Comparison: None.

CLINICAL DATA: Preop for knee replacement

CHEST - 2 VIEW

[view not recorded (1 of 2)]
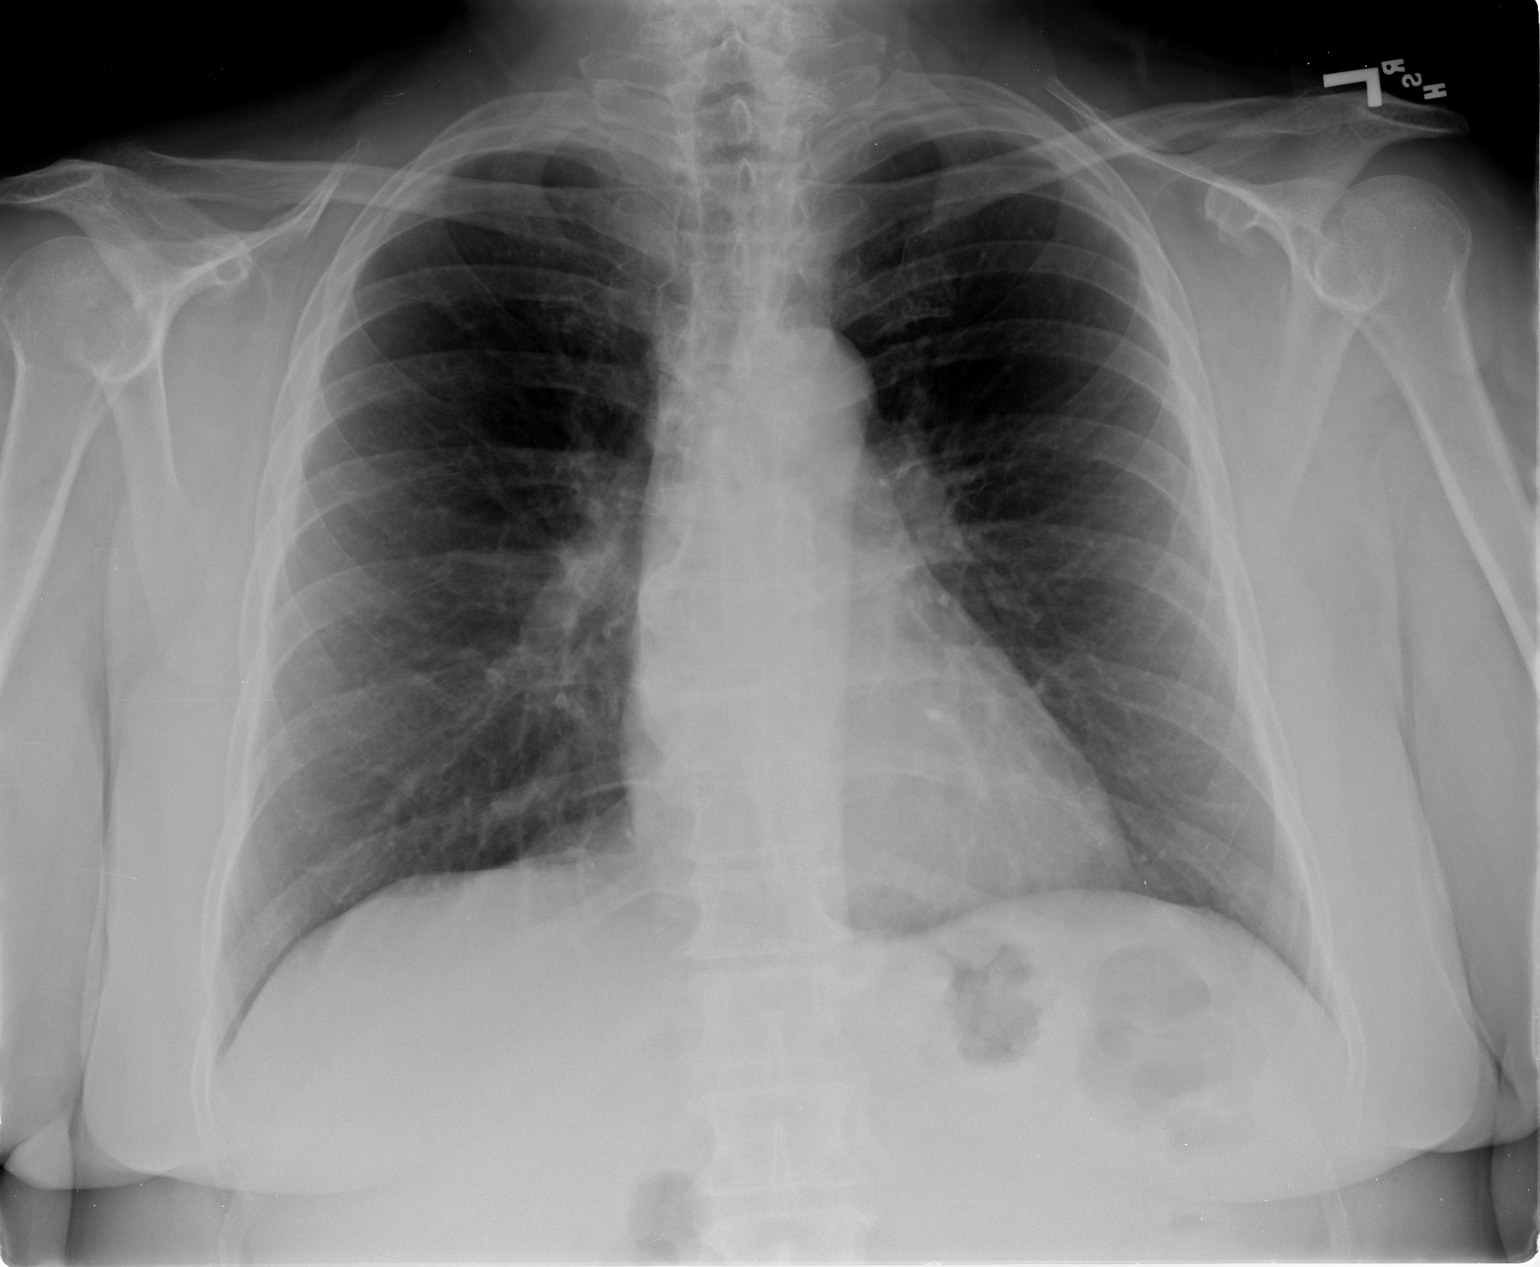

[view not recorded (2 of 2)]
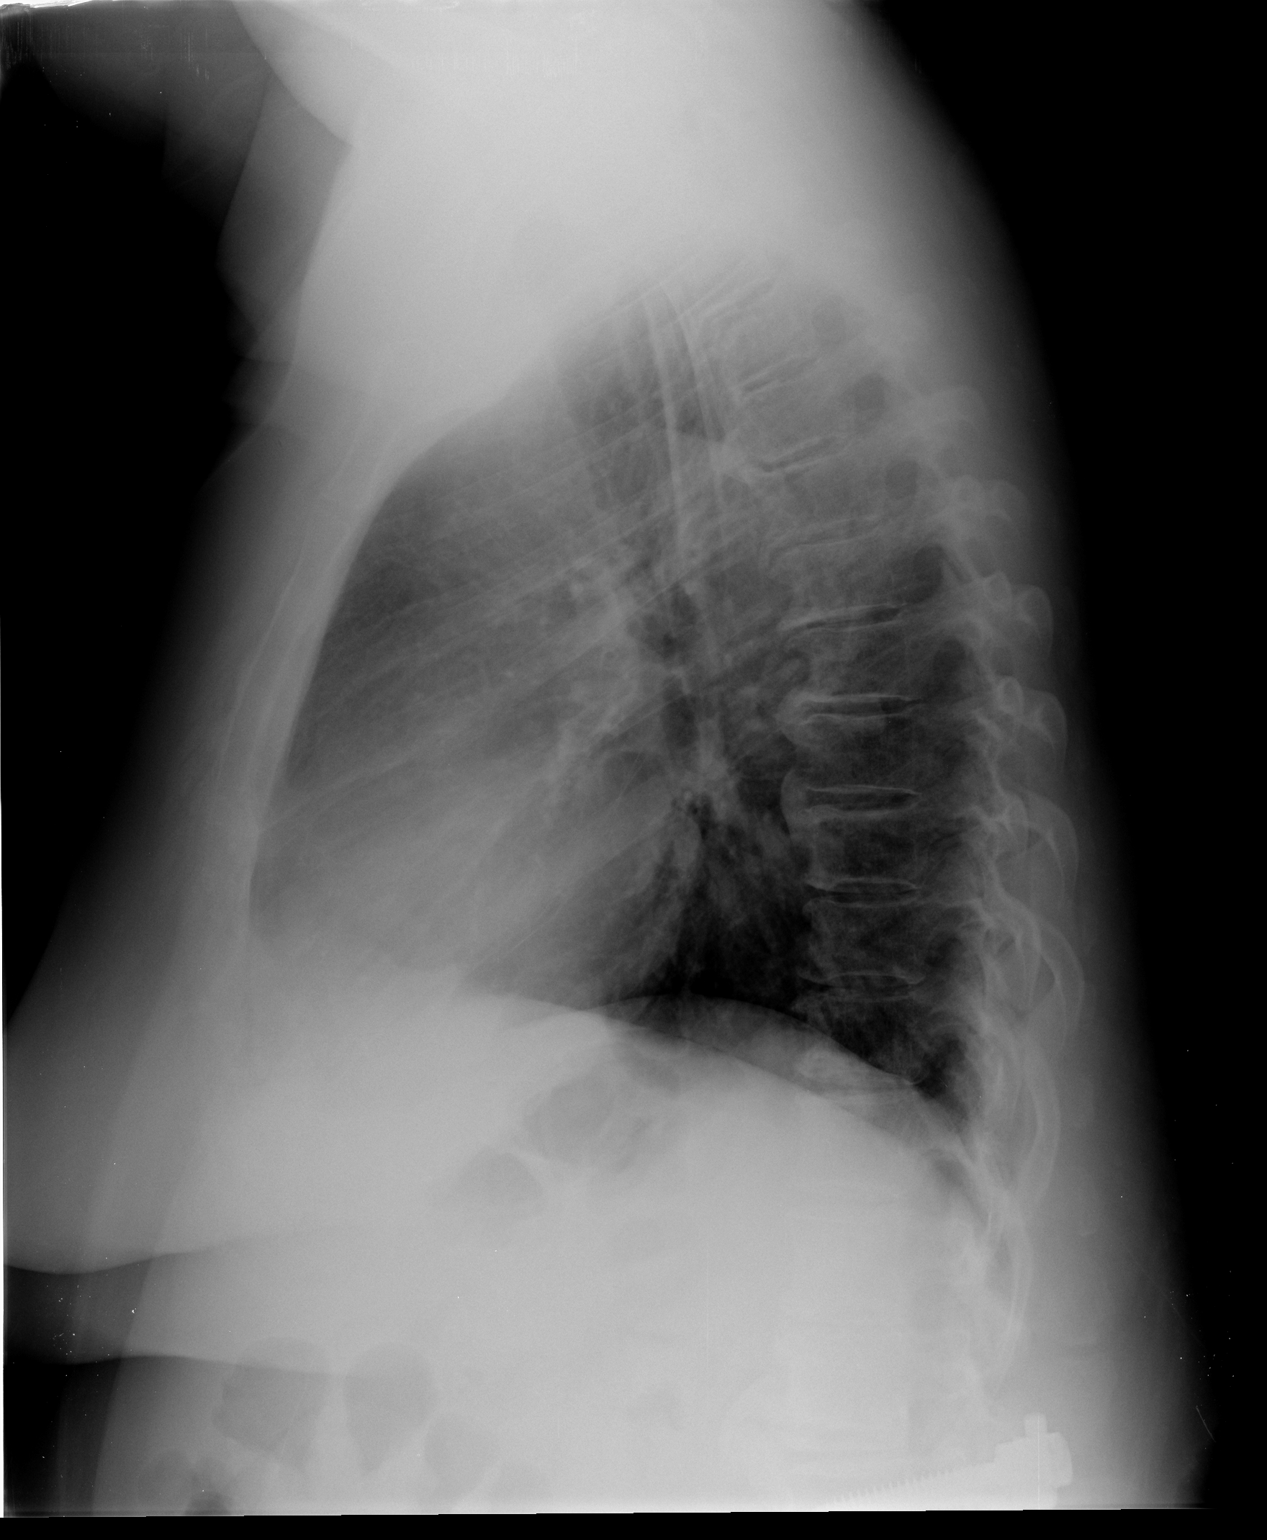

[2 of 2 positions shown; findings below may reference images not displayed]

FINDINGS: No active infiltrate or effusion is seen.  The heart is
borderline enlarged.  There are degenerative changes throughout the
thoracic spine.
IMPRESSION: Borderline cardiomegaly.  No active lung disease.

## 2013-03-18 ENCOUNTER — Ambulatory Visit: Payer: Self-pay | Admitting: Internal Medicine

## 2013-06-21 ENCOUNTER — Ambulatory Visit: Payer: Self-pay | Admitting: Internal Medicine

## 2013-07-11 ENCOUNTER — Ambulatory Visit: Payer: Self-pay | Admitting: Internal Medicine

## 2013-11-06 ENCOUNTER — Observation Stay: Payer: Self-pay | Admitting: Internal Medicine

## 2013-11-06 LAB — BASIC METABOLIC PANEL
Anion Gap: 4 — ABNORMAL LOW (ref 7–16)
BUN: 9 mg/dL (ref 7–18)
CALCIUM: 11.2 mg/dL — AB (ref 8.5–10.1)
CHLORIDE: 104 mmol/L (ref 98–107)
Co2: 31 mmol/L (ref 21–32)
Creatinine: 0.95 mg/dL (ref 0.60–1.30)
Glucose: 93 mg/dL (ref 65–99)
Osmolality: 276 (ref 275–301)
Potassium: 3.4 mmol/L — ABNORMAL LOW (ref 3.5–5.1)
SODIUM: 139 mmol/L (ref 136–145)

## 2013-11-06 LAB — CBC
HCT: 40 % (ref 35.0–47.0)
HGB: 13.9 g/dL (ref 12.0–16.0)
MCH: 32.6 pg (ref 26.0–34.0)
MCHC: 34.9 g/dL (ref 32.0–36.0)
MCV: 93 fL (ref 80–100)
Platelet: 331 10*3/uL (ref 150–440)
RBC: 4.28 10*6/uL (ref 3.80–5.20)
RDW: 12.8 % (ref 11.5–14.5)
WBC: 7.6 10*3/uL (ref 3.6–11.0)

## 2013-11-06 LAB — TROPONIN I: Troponin-I: <0.02 ng/mL

## 2013-11-07 DIAGNOSIS — R079 Chest pain, unspecified: Secondary | ICD-10-CM

## 2013-11-07 LAB — LIPID PANEL
Cholesterol: 222 mg/dL — ABNORMAL HIGH (ref 0–200)
HDL Cholesterol: 29 mg/dL — ABNORMAL LOW (ref 40–60)
Ldl Cholesterol, Calc: 119 mg/dL — ABNORMAL HIGH (ref 0–100)
TRIGLYCERIDES: 372 mg/dL — AB (ref 0–200)
VLDL Cholesterol, Calc: 74 mg/dL — ABNORMAL HIGH (ref 5–40)

## 2013-11-07 LAB — TROPONIN I

## 2013-11-07 LAB — CK TOTAL AND CKMB (NOT AT ARMC)
CK, TOTAL: 78 U/L (ref 21–215)
CK, TOTAL: 54 U/L (ref 21–215)
CK, Total: 62 U/L (ref 21–215)
CK-MB: 1.4 ng/mL (ref 0.5–3.6)
CK-MB: 1.4 ng/mL (ref 0.5–3.6)
CK-MB: 1.6 ng/mL (ref 0.5–3.6)

## 2014-06-25 ENCOUNTER — Emergency Department: Payer: Self-pay | Admitting: Emergency Medicine

## 2014-06-30 ENCOUNTER — Ambulatory Visit: Payer: Self-pay | Admitting: Internal Medicine

## 2014-06-30 LAB — WOUND CULTURE

## 2014-07-12 ENCOUNTER — Ambulatory Visit: Payer: Self-pay | Admitting: Surgery

## 2014-07-12 DIAGNOSIS — Z01810 Encounter for preprocedural cardiovascular examination: Secondary | ICD-10-CM

## 2014-07-12 DIAGNOSIS — I10 Essential (primary) hypertension: Secondary | ICD-10-CM

## 2014-07-12 LAB — CBC
HCT: 37.8 % (ref 35.0–47.0)
HGB: 12.5 g/dL (ref 12.0–16.0)
MCH: 31.8 pg (ref 26.0–34.0)
MCHC: 33.1 g/dL (ref 32.0–36.0)
MCV: 96 fL (ref 80–100)
PLATELETS: 257 10*3/uL (ref 150–440)
RBC: 3.93 10*6/uL (ref 3.80–5.20)
RDW: 12.5 % (ref 11.5–14.5)
WBC: 6.4 10*3/uL (ref 3.6–11.0)

## 2014-07-12 LAB — BASIC METABOLIC PANEL
ANION GAP: 4 — AB (ref 7–16)
BUN: 10 mg/dL (ref 7–18)
Calcium, Total: 9.2 mg/dL (ref 8.5–10.1)
Chloride: 109 mmol/L — ABNORMAL HIGH (ref 98–107)
Co2: 29 mmol/L (ref 21–32)
Creatinine: 0.81 mg/dL (ref 0.60–1.30)
GLUCOSE: 154 mg/dL — AB (ref 65–99)
Osmolality: 285 (ref 275–301)
Potassium: 3.5 mmol/L (ref 3.5–5.1)
SODIUM: 142 mmol/L (ref 136–145)

## 2014-07-17 ENCOUNTER — Ambulatory Visit: Payer: Self-pay | Admitting: Surgery

## 2014-07-20 ENCOUNTER — Ambulatory Visit: Payer: Self-pay | Admitting: Surgery

## 2014-07-24 LAB — PATHOLOGY REPORT

## 2015-01-26 ENCOUNTER — Ambulatory Visit
Admit: 2015-01-26 | Disposition: A | Discharge status: Home / Self Care | Payer: Self-pay | Attending: Internal Medicine | Admitting: Internal Medicine

## 2015-01-27 NOTE — H&P (Signed)
PATIENT NAME:  Emily Velasquez, BUBAR MR#:  [id number] DATE OF BIRTH:  1961/07/12  DATE OF ADMISSION:  11/06/2013  PRIMARY CARE PHYSICIAN: Dr. Arlana Pouch.   CHIEF COMPLAINT: Chest pain.   HISTORY OF PRESENT ILLNESS: A 54 year old Caucasian female with past medical history of hypertension, gastroesophageal reflux disease, osteoarthritis with chronic pain including lumbago, presenting with chest pain. Acute onset of chest pain retrosternal in location. Described merely as "pain," 10 out of 10 in intensity, radiating across her chest and up her neck on bilateral sides. She notices it to be worse with walking. She found no relieving factors. Had associated shortness of breath described as difficulty catching breath and a choking sensation; however, denies any diaphoresis or nausea. Upon arrival to the Emergency Department, she had aspirin and nitroglycerin with mild improvement in symptoms, in addition to 1 mg of Dilaudid IV. Stating her pain is still present, though decreased to about 5 out of 10 in intensity. Also on arrival, she was noted to be markedly hypertensive with systolic blood pressures in the 200s. She has received nitroglycerin and nitro paste. Her blood pressure has improved as well. Currently, she is without complaints.   REVIEW OF SYSTEMS:   CONSTITUTIONAL: Denies fever, fatigue, weakness, pain.  EYES: Denies blurry vision, double vision or eye pain.  ENT: Denies tinnitus, ear pain or hearing loss.  RESPIRATORY: Denies cough, wheeze. Positive for shortness of breath as described above.  CARDIOVASCULAR: Positive for chest pain as described above; however, denies any orthopnea, edema, palpitations.  GASTROINTESTINAL: Denies nausea, vomiting, diarrhea, abdominal pain.  GENITOURINARY: Denies dysuria or hematuria.  ENDOCRINE: Denies nocturia or thyroid problems.  HEMATOLOGIC AND LYMPHATIC: Denies easy bruising, bleeding.  SKIN: Denies rash or lesion.  MUSCULOSKELETAL: Denies pain in neck, back,  shoulder, knees, hips or arthritic symptoms currently.  NEUROLOGIC: Denies paralysis, paresthesias.  PSYCHIATRIC: Denies anxiety or depressive symptoms.   Otherwise, full review of systems performed by me is negative.   PAST MEDICAL HISTORY: Hypertension, gastroesophageal reflux disease, migraines, lumbago.    SOCIAL HISTORY: Every day tobacco user, occasional alcohol use.   FAMILY HISTORY: Positive for coronary artery disease, diabetes, rheumatoid arthritis.   ALLERGIES: NIACIN, ROBAXIN, SULFA DRUGS.   HOME MEDICATIONS: Include oxycodone 20 mg p.o. 5 times daily, hydrochlorothiazide/losartan 12.5/50 mg p.o. daily, metoprolol 100 mg p.o. daily, Lasix 20 mg p.o. b.i.d.   PHYSICAL EXAMINATION:  VITAL SIGNS: Temperature 98.2, respirations 22, heart rate 92, blood pressure 203/111 on arrival, saturating 97% on room air. Current blood pressure 182/92. Weight 97.5 kg, BMI 38.1.  GENERAL: Well-nourished, well-developed, obese, Caucasian female, currently in mild distress secondary to pain.  HEAD: Normocephalic, atraumatic.  EYES: Pupils equal, round and reactive to light. Extraocular muscles intact. No scleral icterus.  MOUTH: Moist mucosal membranes. Dentition intact. No abscess noted.  EARS, NOSE, THROAT: Throat is clear without exudates. No external lesions.  NECK: Supple. No thyromegaly. No nodules. No JVD.  PULMONARY: Clear to auscultation bilaterally without wheezes, rubs, rhonchi. No use of accessory muscles. Good respiratory effort.  CHEST: Nontender to palpation.  CARDIOVASCULAR: S1, S2, regular rate and rhythm. No murmurs, rubs or gallops. No edema. Pedal pulses 2+ bilaterally.  GASTROINTESTINAL: Soft, nontender, nondistended. No masses. Positive bowel sounds. No hepatosplenomegaly. Obese.  MUSCULOSKELETAL: No swelling, clubbing, edema. Range of motion full in all extremities.  NEUROLOGIC: Cranial nerves II through XII intact. No gross focal neurological deficits. Sensation intact.  Reflexes intact.  SKIN: No ulcerations, lesions, rash, cyanosis. Skin warm, dry. Turgor intact.  PSYCHIATRIC: Mood  and affect within normal limits. The patient is awake, alert, oriented x 3. Insight and judgment intact.   LABORATORY DATA: Sodium 139, , chloride 104, bicarb 31, BUN 9, creatinine 0.95, glucose 93. Calcium 11.2. Troponin I less than 0.02. WBC 7.6, hemoglobin 13.9, platelets 331. D-dimer positive at 1.14. Chest x-ray performed revealing no acute cardiopulmonary process. EKG performed. Normal sinus rhythm. No ST or T wave abnormalities.   ASSESSMENT AND PLAN: A 54 year old Caucasian female with history of hypertension, gastroesophageal reflux disease and chronic pain, presenting with chest pain.  1. Chest pain: Admit to telemetry under observation. Trend cardiac enzymes x 3. Provide aspirin as well as statin therapy. Given that D-dimer is positive in the Emergency Department, will check a CTA to rule out pulmonary embolus, though seems unlikely as Wells score is low. Regardless, if positive, will start on heparin therapy. She has no contraindications to anticoagulation.  2. Hypertensive urgency: Blood pressure improved with nitroglycerin. Will add p.r.n. hydralazine for systolic blood pressure greater than 180 or diastolic greater than 100, as well as continue her home medications including hydrochlorothiazide, Lasix and losartan.  3. Hypercalcemia: Asymptomatic. Provide intravenous fluid hydration and follow calcium level.  4. Chronic pain: She is on 20 mg of oxycodone 5 times daily. Continue her pain medications.  5. Venous thromboembolism prophylaxis with heparin subcutaneous.   The patient is FULL CODE.   TIME SPENT: 45 minutes.   ____________________________ Cletis Athensavid K. Orpha Dain, MD dkh:gb D: 11/06/2013 22:49:24 ET T: 11/07/2013 03:40:29 ET JOB#: [id number]  cc: Cletis Athensavid K. Tara Rud, MD, <Dictator> Alandria Butkiewicz Synetta ShadowK Paul Torpey MD ELECTRONICALLY SIGNED 11/07/2013 21:46

## 2015-01-27 NOTE — Discharge Summary (Signed)
PATIENT NAME:  Emily Velasquez, Emily Velasquez MR#:  [id number] DATE OF BIRTH:  10-26-1960  DATE OF ADMISSION:  11/06/2013 DATE OF DISCHARGE:  11/07/2013  ADMITTING PHYSICIAN: Dr. Angelica Ran.    DISCHARGING PHYSICIAN: Dr. Enid Baas.   PRIMARY CARE PHYSICIAN: Dr. Dewaine Oats.   CONSULTATIONS IN THE HOSPITAL: None.   DISCHARGE DIAGNOSES:  1. Chest pain secondary to musculoskeletal in nature.  2. Acute bronchitis.  3. Hypertension.  4. Left eye conjunctivitis.  5. Gastroesophageal reflux disease.  6. Chronic low back pain.  7. Obesity.  8. Hypercalcemia. To be followed up by primary care physician.   HOME MEDICATIONS:  1. Oxycodone 20 mg extended release 5 times a day. This is the patient's home medication. No changes were made.  2. HCTZ/losartan 12.5/50 mg 1 tablet p.o. daily.  3. Toprol 100 mg p.o. daily.  4. Lasix 20 mg p.o. b.i.d.  5. Atorvastatin 20 mg p.o. daily.  6. Polymyxin trimethoprim ophthalmic drops 2 drops left eye 4 times a day for 6 days.  7. Protonix 40 mg p.o. daily.  8. Prednisone taper over 6 days.  9. Z-Pak for 5 days.   DISCHARGE HOME OXYGEN: None.   DISCHARGE DIET: Low-sodium diet.   DISCHARGE ACTIVITY: As tolerated.    FOLLOWUP INSTRUCTIONS: PCP followup in 1 to 2 weeks. Follow up hypercalcemia with PCP.   LABS AND IMAGING STUDIES PRIOR TO DISCHARGE:  1. WBC is 7.6, hemoglobin 13.9, hematocrit 40.0, platelet count 331.  2. Sodium 139, potassium 3.4, chloride 104, bicarb 31, BUN 9, creatinine 0.95, glucose 93 and calcium of 11.2.  3. D-dimer is 1.14.  4. CK, CK-MB and troponins x 3 are negative.  5. Chest x-ray revealing no edema or consolidation.  6. CT angio of the chest revealing no PE. Small nodular opacity in the left upper lobe and infectious or inflammatory process is favored at this time.  7. LDL cholesterol 119, VLDL 74, HDL 29, total cholesterol 045 and triglycerides of 372.  8. Myocardial scan revealing no significant wall motion abnormality.  Low risk scan. EF is 68%. Normal left ventricular global function. No EKG changes concerning for ischemia noted.   BRIEF HOSPITAL COURSE: Emily Velasquez is a 54 year old, obese, Caucasian female with past medical history significant for hypertension, chronic low back pain, gastroesophageal reflux disease who presented to the hospital secondary to chest tightness and also cough.  1. Chest pain: Could be from musculoskeletal pain from coughing from her acute bronchitis. Her CT chest was negative for PE, though it showed a small left upper lobe nodule which could be infectious or inflammatory in nature, but because of her risk factors including ongoing smoking and family history of heart disease and hypertension, the patient had a Myoview scan done which was negative for any ischemia or infarcts. Her troponins were negative x 3, so the patient is being discharged home in a stable condition and requested to follow up with her primary care physician. Also for her bronchitis, she is being discharged on prednisone taper with Z-Pak at this time.  2. Hypercalcemia: Calcium was elevated at 11. Could be from dehydration versus primary hypercalcemia. Further workup needs to be done by the PCP as an outpatient.  3. Chronic low back pain: Her oxycodone was continued without any changes.  4. Hypertension: The patient on Toprol, Lasix, lisinopril, losartan which were continued.  5. Left eye conjunctivitis: Redness of the left eye with some mattered stuff noted in the margins. Started on polymyxin trimethoprim drops.  Her course has been otherwise uneventful in the hospital.   DISCHARGE CONDITION: Stable.   DISCHARGE DISPOSITION: Home.   TIME SPENT ON DISCHARGE: 45 minutes.   ____________________________ Enid Baasadhika Dandre Sisler, MD rk:gb D: 11/07/2013 16:13:41 ET T: 11/08/2013 04:15:50 ET JOB#: [id number]  cc: Enid Baasadhika Gedeon Brandow, MD, <Dictator> Jillene Bucksenny C. Arlana Pouchate, MD Enid BaasADHIKA Lynnex Fulp MD ELECTRONICALLY SIGNED 11/10/2013  15:10

## 2015-01-27 NOTE — Op Note (Signed)
PATIENT NAME:  Emily Velasquez, Emily Velasquez MR#:  [id number] DATE OF BIRTH:  1961/07/27  DATE OF PROCEDURE:  07/20/2014  PREOPERATIVE DIAGNOSIS: Left breast mass.   POSTOPERATIVE DIAGNOSIS: Left breast mass.  PROCEDURE: Left breast biopsy.   SURGEON: Rennie Rouch E. Excell Seltzerooper, M.D.   ASSISTANT: Docia ChuckKirk Williams, PA-S.   INDICATIONS: This is a patient with a left breast mass, possibly an infected sebaceous cyst, which has healed, but recurred. Preoperatively, we discussed the rationale for surgery, the options of observation, the risk of bleeding, infection, recurrence, and cosmetic deformity. This was all reviewed for her in the preoperative holding area. She understood and agreed to proceed.   FINDINGS: Left breast mass involving the skin and deep subcutaneous tissues.   DESCRIPTION OF PROCEDURE: The patient was induced with general anesthesia. VTE prophylaxis was in place. She was prepped and draped in a sterile fashion. Marcaine was infiltrated in the skin and subcutaneous tissues around the previously marked visible and palpable lesion in the medial portion of the left breast, and the incision was planned involving and incorporating the existing breast reduction incision scar, and then a lenticular-shaped incision was utilized to excise the mass completely. There was a small area of what appeared to be possible granulation tissue or cyst wall on the inferior border, which was re-excised and sent off together in 1 specimen labeled left breast mass.   Hemostasis was with electrocautery. Additional Marcaine was placed for a total of 20 mL, and once assuring that hemostasis was adequate, the wound was closed with deep sutures of figure-of-eight 3-0 Vicryl, followed by 4-0 subcuticular Monocryl. Steri-Strips, Mastisol and sterile dressings were placed.   The patient tolerated the procedure well. There were no complications. She was taken to the recovery room in stable condition to be discharged to the care of her  family. Follow up in 10 days.   ____________________________ Adah Salvageichard E. Excell Seltzerooper, MD rec:JT D: 07/20/2014 07:53:52 ET T: 07/20/2014 13:31:59 ET JOB#: [id number]  cc: Adah Salvageichard E. Excell Seltzerooper, MD, <Dictator> Lattie HawICHARD E Mykela Mewborn MD ELECTRONICALLY SIGNED 07/20/2014 17:37

## 2015-02-07 IMAGING — CR DG CHEST 1V PORT
1 series · 1 of 1 positions shown · non-contrast
Comparison: 03/18/2013

CLINICAL DATA: Chest pain, hypertension, shortness of breath

EXAM:
PORTABLE CHEST - 1 VIEW

[ap]
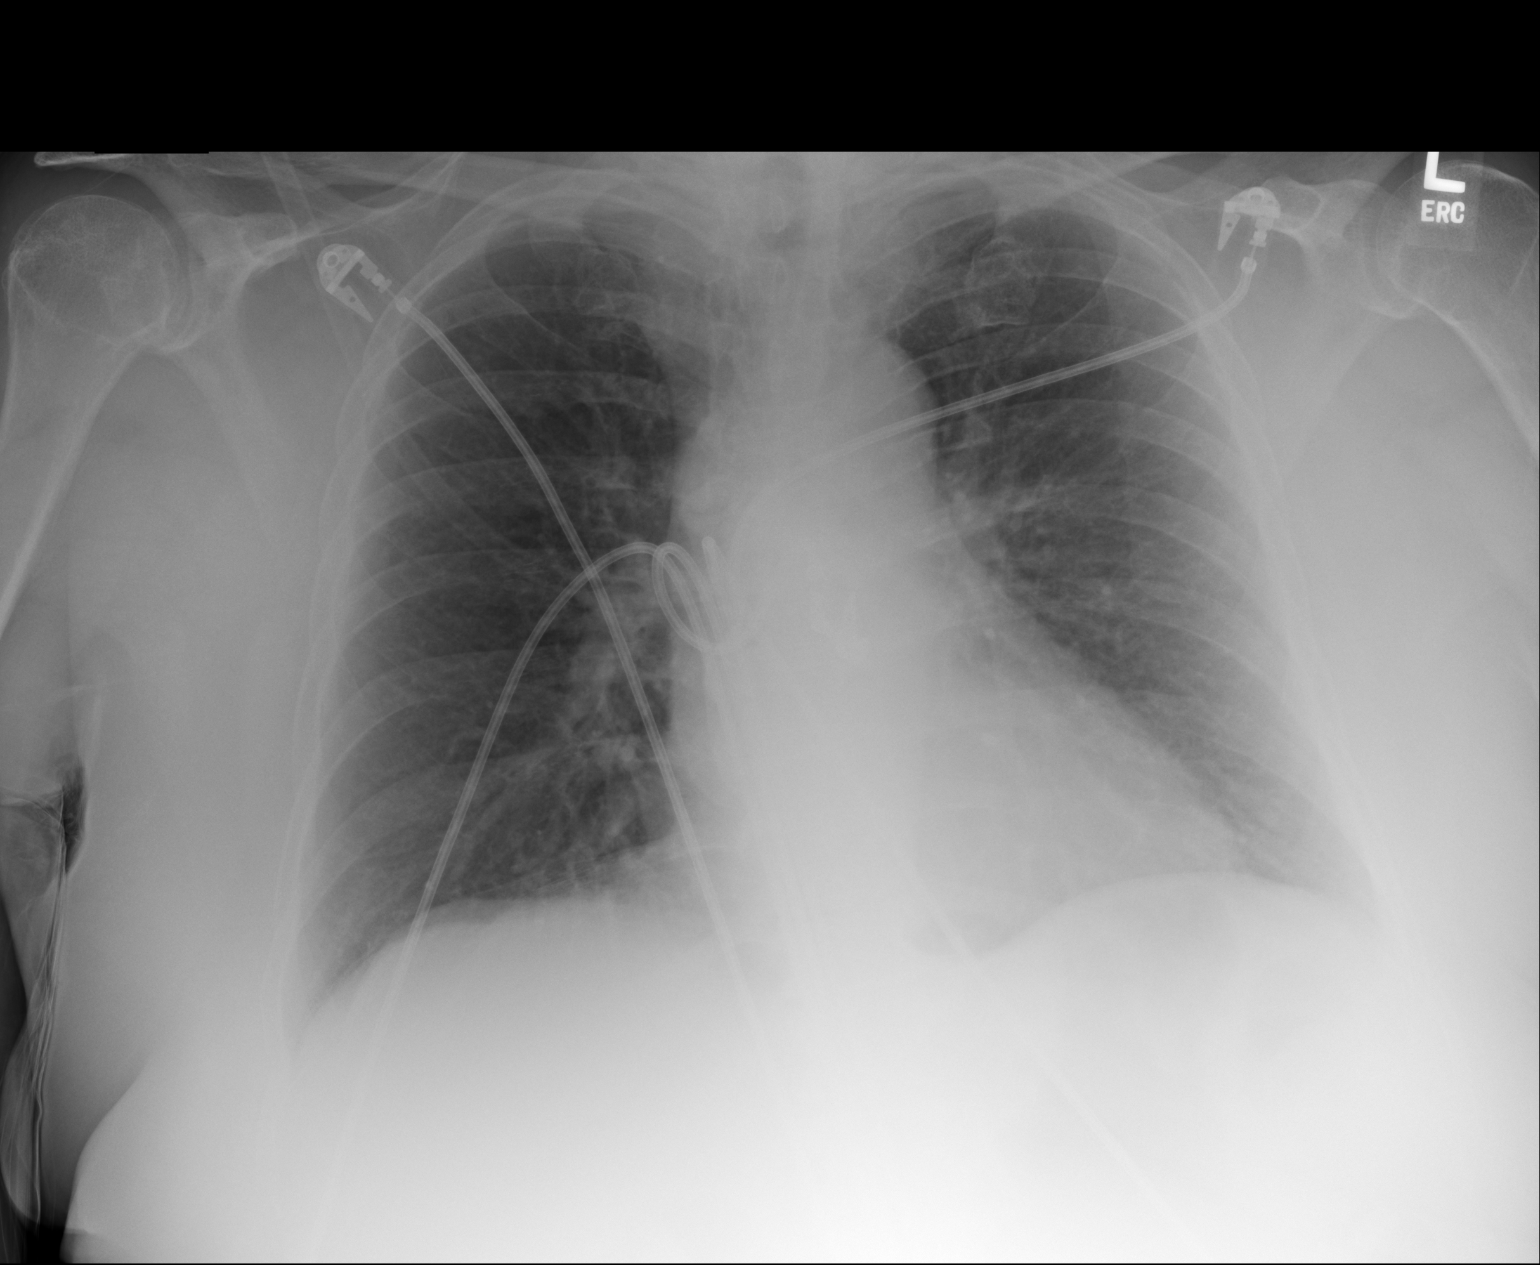

[1 of 1 positions shown; findings below may reference images not displayed]

FINDINGS: Interstitial markings are mildly coarsened, but there is no edema or
consolidation. No effusion or pneumothorax. Normal heart size.
IMPRESSION: No edema or consolidation.

## 2015-03-20 ENCOUNTER — Encounter: Payer: Self-pay | Admitting: *Deleted

## 2015-03-21 ENCOUNTER — Ambulatory Visit: Payer: Medicare Other | Admitting: Anesthesiology

## 2015-03-21 ENCOUNTER — Encounter
Admission: RE | Disposition: A | Discharge status: Home / Self Care | Payer: Self-pay | Source: Ambulatory Visit | Attending: Unknown Physician Specialty

## 2015-03-21 ENCOUNTER — Ambulatory Visit
Admission: RE | Admit: 2015-03-21 | Discharge: 2015-03-21 | Disposition: A | Discharge status: Home / Self Care | Payer: Medicare Other | Source: Ambulatory Visit | Attending: Unknown Physician Specialty | Admitting: Unknown Physician Specialty

## 2015-03-21 ENCOUNTER — Encounter: Payer: Self-pay | Admitting: Unknown Physician Specialty

## 2015-03-21 DIAGNOSIS — E669 Obesity, unspecified: Secondary | ICD-10-CM | POA: Diagnosis not present

## 2015-03-21 DIAGNOSIS — Z79891 Long term (current) use of opiate analgesic: Secondary | ICD-10-CM | POA: Insufficient documentation

## 2015-03-21 DIAGNOSIS — Z79899 Other long term (current) drug therapy: Secondary | ICD-10-CM | POA: Insufficient documentation

## 2015-03-21 DIAGNOSIS — R1310 Dysphagia, unspecified: Secondary | ICD-10-CM | POA: Insufficient documentation

## 2015-03-21 DIAGNOSIS — I10 Essential (primary) hypertension: Secondary | ICD-10-CM | POA: Insufficient documentation

## 2015-03-21 DIAGNOSIS — F17210 Nicotine dependence, cigarettes, uncomplicated: Secondary | ICD-10-CM | POA: Insufficient documentation

## 2015-03-21 DIAGNOSIS — K219 Gastro-esophageal reflux disease without esophagitis: Secondary | ICD-10-CM | POA: Diagnosis not present

## 2015-03-21 HISTORY — DX: Sleep apnea, unspecified: G47.30

## 2015-03-21 HISTORY — DX: Depression, unspecified: F32.A

## 2015-03-21 HISTORY — DX: Unspecified osteoarthritis, unspecified site: M19.90

## 2015-03-21 HISTORY — DX: Dysphagia, unspecified: R13.10

## 2015-03-21 HISTORY — DX: Major depressive disorder, single episode, unspecified: F32.9

## 2015-03-21 HISTORY — DX: Hyperlipidemia, unspecified: E78.5

## 2015-03-21 HISTORY — DX: Polyp of colon: K63.5

## 2015-03-21 HISTORY — DX: Obesity, unspecified: E66.9

## 2015-03-21 HISTORY — DX: Edema, unspecified: R60.9

## 2015-03-21 HISTORY — DX: Essential (primary) hypertension: I10

## 2015-03-21 HISTORY — DX: Gastro-esophageal reflux disease without esophagitis: K21.9

## 2015-03-21 HISTORY — PX: ESOPHAGOGASTRODUODENOSCOPY: SHX5428

## 2015-03-21 SURGERY — EGD (ESOPHAGOGASTRODUODENOSCOPY)
Anesthesia: General

## 2015-03-21 MED ORDER — PROPOFOL INFUSION 10 MG/ML OPTIME
INTRAVENOUS | Status: DC | PRN
  Administered 2015-03-21: 150 ug/kg/min via INTRAVENOUS

## 2015-03-21 MED ORDER — SODIUM CHLORIDE 0.9 % IV SOLN: INTRAVENOUS | Status: DC

## 2015-03-21 MED ORDER — FENTANYL CITRATE (PF) 100 MCG/2ML IJ SOLN
INTRAMUSCULAR | Status: DC | PRN
  Administered 2015-03-21: 50 ug via INTRAVENOUS

## 2015-03-21 MED ORDER — MIDAZOLAM HCL 5 MG/5ML IJ SOLN
INTRAMUSCULAR | Status: DC | PRN
  Administered 2015-03-21: 1 mg via INTRAVENOUS

## 2015-03-21 MED ORDER — PIPERACILLIN-TAZOBACTAM 3.375 G IVPB 30 MIN
3.3750 g | Freq: Once | INTRAVENOUS | Status: AC
  Administered 2015-03-21: 3.375 g via INTRAVENOUS
  Filled 2015-03-21: qty 50

## 2015-03-21 MED ORDER — PROPOFOL 10 MG/ML IV BOLUS
INTRAVENOUS | Status: DC | PRN
  Administered 2015-03-21: 50 mg via INTRAVENOUS

## 2015-03-21 MED ORDER — SODIUM CHLORIDE 0.9 % IV SOLN
INTRAVENOUS | Status: DC
  Administered 2015-03-21: 10:00:00 via INTRAVENOUS

## 2015-03-21 MED ORDER — LIDOCAINE HCL (PF) 2 % IJ SOLN
INTRAMUSCULAR | Status: DC | PRN
  Administered 2015-03-21: 40 mg

## 2015-03-21 NOTE — Anesthesia Postprocedure Evaluation (Signed)
  Anesthesia Post-op Note  Patient: Emily Velasquez  Procedure(s) Performed: Procedure(s): ESOPHAGOGASTRODUODENOSCOPY (EGD) (N/A)  Anesthesia type:General  Patient location: PACU  Post pain: Pain level controlled  Post assessment: Post-op Vital signs reviewed, Patient's Cardiovascular Status Stable, Respiratory Function Stable, Patent Airway and No signs of Nausea or vomiting  Post vital signs: Reviewed and stable  Last Vitals:  Filed Vitals:   03/21/15 1150  BP: 148/82  Pulse: 78  Temp:   Resp: 24    Level of consciousness: awake, alert  and patient cooperative  Complications: No apparent anesthesia complications

## 2015-03-21 NOTE — Op Note (Signed)
[facility] Gastroenterology Patient Name: Emily Velasquez Procedure Date: 03/21/2015 11:03 AM MRN: [id number] Account #: [id number] Date of Birth: 05-13-1961 Admit Type: Outpatient Age: 54 Room: [facility] ENDO ROOM 4 Gender: Female Note Status: Finalized Procedure:         Upper GI endoscopy Indications:       Dysphagia Providers:         Scot Jun, MD Referring MD:      Jillene Bucks. Arlana Pouch, MD (Referring MD) Medicines:         Propofol per Anesthesia Complications:     No immediate complications. Procedure:         Pre-Anesthesia Assessment:                    - After reviewing the risks and benefits, the patient was                     deemed in satisfactory condition to undergo the procedure.                    After obtaining informed consent, the endoscope was passed                     under direct vision. Throughout the procedure, the                     patient's blood pressure, pulse, and oxygen saturations                     were monitored continuously. The Olympus GIF-160 endoscope                     (S#. [id number]) was introduced through the mouth, and                     advanced to the second part of duodenum. The upper GI                     endoscopy was accomplished without difficulty. The patient                     tolerated the procedure well. Findings:      The examined esophagus was normal. A guidewire was placed and the scope       was withdrawn. Dilation was performed with a Savary dilator with no       resistance at 16 mm and 17 mm. GEJ 40cm.      The stomach was normal.      The examined duodenum was normal. Impression:        - Normal esophagus. Dilated.                    - Normal stomach.                    - Normal examined duodenum.                    - No specimens collected. Recommendation:    - The findings and recommendations were discussed with the                     patient's family. Scot Jun, MD 03/21/2015 11:30:02  AM This report has been signed electronically. Number of Addenda: 0 Note Initiated On: 03/21/2015 11:03 AM  [facility]

## 2015-03-21 NOTE — H&P (Signed)
Primary Care Physician:  Jaclyn Shaggy, MD Primary Gastroenterologist:  Dr. Mechele Collin  Pre-Procedure History & Physical: HPI:  Emily Velasquez is a 54 y.o. female is here for an endoscopy.   Past Medical History  Diagnosis Date  . Obesity   . Hypertension   . Hyperlipemia   . Sleep apnea   . Arthritis   . Depression   . GERD (gastroesophageal reflux disease)   . Acute edema   . Colon polyps   . Dysphagia     Past Surgical History  Procedure Laterality Date  . Back surgery    . Joint replacement    . Rotator cuff repair Left   . Elbow surgery Left   . Achilles tendon repair Left   . Reduction mammaplasty    . Abdominal hysterectomy    . Appendectomy    . Kidney stone surgery    . Colonoscopy    . Upper gastrointestinal endoscopy      Prior to Admission medications   Medication Sig Start Date End Date Taking? Authorizing Provider  ALPRAZolam Prudy Feeler) 1 MG tablet Take 1 mg by mouth at bedtime as needed for anxiety.   Yes Historical Provider, MD  furosemide (LASIX) 20 MG tablet Take 60 mg by mouth daily.   Yes Historical Provider, MD  losartan-hydrochlorothiazide (HYZAAR) 50-12.5 MG per tablet Take 1 tablet by mouth daily.   Yes Historical Provider, MD  meloxicam (MOBIC) 15 MG tablet Take 15 mg by mouth daily as needed for pain.   Yes Historical Provider, MD  metoprolol succinate (TOPROL-XL) 50 MG 24 hr tablet Take 50 mg by mouth daily. Take with or immediately following a meal.   Yes Historical Provider, MD  oxycodone (ROXICODONE) 30 MG immediate release tablet Take 30 mg by mouth 3 (three) times daily.   Yes Historical Provider, MD  pantoprazole (PROTONIX) 40 MG tablet Take 40 mg by mouth daily.   Yes Historical Provider, MD  ondansetron (ZOFRAN) 4 MG tablet Take 4 mg by mouth daily as needed for nausea or vomiting.    Historical Provider, MD    Allergies as of 03/19/2015  . (Not on File)    No family history on file.  History   Social History  . Marital  Status: Divorced    Spouse Name: N/A  . Number of Children: N/A  . Years of Education: N/A   Occupational History  . Not on file.   Social History Main Topics  . Smoking status: Not on file  . Smokeless tobacco: Not on file  . Alcohol Use: Not on file  . Drug Use: Not on file  . Sexual Activity: Not on file   Other Topics Concern  . Not on file   Social History Narrative  . No narrative on file    Review of Systems: See HPI, otherwise negative ROS  Physical Exam: BP 181/96 mmHg  Pulse 73  Temp(Src) 96.5 F (35.8 C) (Tympanic)  Resp 17  Ht  (1.6 m)  Wt 103.42 kg (228 lb)  BMI 40.40 kg/m2  SpO2 98% General:   Alert,  pleasant and cooperative in NAD Head:  Normocephalic and atraumatic. Neck:  Supple; no masses or thyromegaly. Lungs:  Clear throughout to auscultation.    Heart:  Regular rate and rhythm. Abdomen:  Soft, nontender and nondistended. Normal bowel sounds, without guarding, and without rebound.   Neurologic:  Alert and  oriented x4;  grossly normal neurologically.  Impression/Plan: Emily Velasquez is here for  an endoscopy to be performed for dysphagia  Risks, benefits, limitations, and alternatives regarding  endoscopy have been reviewed with the patient.  Questions have been answered.  All parties agreeable.   Lynnae Prude, MD  03/21/2015, 11:10 AM

## 2015-03-21 NOTE — Transfer of Care (Signed)
Immediate Anesthesia Transfer of Care Note  Patient: Emily Velasquez  Procedure(s) Performed: Procedure(s): ESOPHAGOGASTRODUODENOSCOPY (EGD) (N/A)  Patient Location: PACU  Anesthesia Type:General  Level of Consciousness: awake  Airway & Oxygen Therapy: Patient Spontanous Breathing and Patient connected to nasal cannula oxygen  Post-op Assessment: Report given to RN and Post -op Vital signs reviewed and stable  Post vital signs: Reviewed and stable  Last Vitals:  Filed Vitals:   03/21/15 0937  BP: 181/96  Pulse: 73  Temp: 35.8 C  Resp: 17    Complications: No apparent anesthesia complications

## 2015-03-21 NOTE — OR Nursing (Signed)
Patient dilated with 48 and 51 savary dilators. Patient tolerated procedure well.

## 2015-03-21 NOTE — Anesthesia Preprocedure Evaluation (Signed)
Anesthesia Evaluation  Patient identified by MRN, date of birth, ID band Patient awake    Reviewed: Allergy & Precautions, NPO status , Patient's Chart, lab work & pertinent test results  Airway Mallampati: II  TM Distance: >3 FB Neck ROM: Full    Dental  (+) Upper Dentures, Lower Dentures   Pulmonary COPDCurrent Smoker,    + wheezing      Cardiovascular hypertension, Pt. on medications and Pt. on home beta blockers Normal cardiovascular examRhythm:Regular Rate:Normal     Neuro/Psych    GI/Hepatic GERD-  Medicated,  Endo/Other    Renal/GU      Musculoskeletal   Abdominal (+) + obese,   Peds  Hematology   Anesthesia Other Findings   Reproductive/Obstetrics                             Anesthesia Physical Anesthesia Plan  ASA: III  Anesthesia Plan: General   Post-op Pain Management:    Induction: Intravenous  Airway Management Planned: Nasal Cannula  Additional Equipment:   Intra-op Plan:   Post-operative Plan:   Informed Consent: I have reviewed the patients History and Physical, chart, labs and discussed the procedure including the risks, benefits and alternatives for the proposed anesthesia with the patient or authorized representative who has indicated his/her understanding and acceptance.     Plan Discussed with: CRNA  Anesthesia Plan Comments:         Anesthesia Quick Evaluation

## 2015-12-12 ENCOUNTER — Other Ambulatory Visit: Payer: Self-pay | Admitting: Unknown Physician Specialty

## 2015-12-12 DIAGNOSIS — M25512 Pain in left shoulder: Secondary | ICD-10-CM

## 2015-12-21 ENCOUNTER — Ambulatory Visit
Admission: RE | Admit: 2015-12-21 | Discharge: 2015-12-21 | Disposition: A | Discharge status: Home / Self Care | Payer: Medicare Other | Source: Ambulatory Visit | Attending: Unknown Physician Specialty | Admitting: Unknown Physician Specialty

## 2015-12-21 DIAGNOSIS — M25512 Pain in left shoulder: Secondary | ICD-10-CM | POA: Diagnosis present

## 2015-12-21 DIAGNOSIS — R609 Edema, unspecified: Secondary | ICD-10-CM | POA: Insufficient documentation

## 2015-12-21 DIAGNOSIS — M75102 Unspecified rotator cuff tear or rupture of left shoulder, not specified as traumatic: Secondary | ICD-10-CM | POA: Insufficient documentation

## 2016-01-05 DEATH — deceased
